# Patient Record
Sex: Male | Born: 1975 | Race: White | Hispanic: No | Smoking: Former smoker
Health system: Southern US, Community
[De-identification: ages and names within clinical notes are randomized; demographics above are authoritative.]

## PROBLEM LIST (undated history)

## (undated) HISTORY — PX: NO PAST SURGERIES: SHX2092

---

## 2018-05-11 ENCOUNTER — Emergency Department
Admission: EM | Admit: 2018-05-11 | Discharge: 2018-05-11 | Disposition: A | Discharge status: Home / Self Care | Payer: Self-pay | Attending: Emergency Medicine | Admitting: Emergency Medicine

## 2018-05-11 ENCOUNTER — Encounter: Payer: Self-pay | Admitting: Emergency Medicine

## 2018-05-11 ENCOUNTER — Other Ambulatory Visit: Payer: Self-pay

## 2018-05-11 DIAGNOSIS — J019 Acute sinusitis, unspecified: Secondary | ICD-10-CM | POA: Insufficient documentation

## 2018-05-11 DIAGNOSIS — J329 Chronic sinusitis, unspecified: Secondary | ICD-10-CM

## 2018-05-11 DIAGNOSIS — B9689 Other specified bacterial agents as the cause of diseases classified elsewhere: Secondary | ICD-10-CM | POA: Insufficient documentation

## 2018-05-11 MED ORDER — AMOXICILLIN-POT CLAVULANATE 875-125 MG PO TABS: 1.0000 | ORAL_TABLET | Freq: Two times a day (BID) | ORAL | 0 refills | Status: DC

## 2018-05-11 MED ORDER — FLUTICASONE PROPIONATE 50 MCG/ACT NA SUSP: 1.0000 | Freq: Two times a day (BID) | NASAL | 0 refills | Status: DC

## 2018-05-11 MED ORDER — CETIRIZINE HCL 10 MG PO TABS: 10.0000 mg | ORAL_TABLET | Freq: Every day | ORAL | 0 refills | Status: DC

## 2018-05-11 MED ORDER — PSEUDOEPH-BROMPHEN-DM 30-2-10 MG/5ML PO SYRP: 10.0000 mL | ORAL_SOLUTION | Freq: Four times a day (QID) | ORAL | 0 refills | Status: DC | PRN

## 2018-05-11 NOTE — ED Provider Notes (Signed)
[facility] Emergency Department Provider Note  ____________________________________________  Time seen: Approximately 3:48 PM  I have reviewed the triage vital signs and the nursing notes.   HISTORY  Chief Complaint Generalized Body Aches    HPI Kenneth Greer is a 42 y.o. male presents the emergency department with sinus pressure, sinus headache, nasal congestion, scratchy throat, cough, fever.  Patient presents with 5 days worth of symptoms.  Patient began with nasal congestion and then progressed to include the other symptoms.  Today, patient developed a fever and he presents the emergency department.  He denies any numbness or tingling in the upper or lower extremities.  No shortness of breath or difficulty breathing.  No abdominal pain, nausea vomiting, diarrhea or constipation.  Patient has been trying Tylenol, Motrin, Alka-Seltzer, cough and cold medicine.    History reviewed. No pertinent past medical history.  There are no active problems to display for this patient.   History reviewed. No pertinent surgical history.  Prior to Admission medications   Medication Sig Start Date End Date Taking? Authorizing Provider  amoxicillin-clavulanate (AUGMENTIN) 875-125 MG tablet Take 1 tablet by mouth 2 (two) times daily. 05/11/18   Mischelle Reeg, Delorise RoyalsJonathan D, PA-C  brompheniramine-pseudoephedrine-DM 30-2-10 MG/5ML syrup Take 10 mLs by mouth 4 (four) times daily as needed. 05/11/18   Jamerion Cabello, Delorise RoyalsJonathan D, PA-C  cetirizine (ZYRTEC) 10 MG tablet Take 1 tablet (10 mg total) by mouth daily. 05/11/18   Britnay Magnussen, Delorise RoyalsJonathan D, PA-C  fluticasone (FLONASE) 50 MCG/ACT nasal spray Place 1 spray into both nostrils 2 (two) times daily. 05/11/18   Thais Silberstein, Delorise RoyalsJonathan D, PA-C    Allergies Patient has no allergy information on record.  No family history on file.  Social History Social History   Tobacco Use  . Smoking status: Not on file  Substance Use Topics  . Alcohol  use: Not on file  . Drug use: Not on file     Review of Systems  Constitutional: Positive fever/chills beginning today Eyes: No visual changes. No discharge ENT: Positive for nasal congestion, sinus pressure,, scratchy throat Cardiovascular: no chest pain. Respiratory: no cough. No SOB. Gastrointestinal: No abdominal pain.  No nausea, no vomiting.  No diarrhea.  No constipation. Musculoskeletal: Negative for musculoskeletal pain. Skin: Negative for rash, abrasions, lacerations, ecchymosis. Neurological: Positive for sinus headache, denies focal weakness or numbness. 10-point ROS otherwise negative.  ____________________________________________   PHYSICAL EXAM:  VITAL SIGNS: ED Triage Vitals  Enc Vitals Group     BP 05/11/18 1520 127/84     Pulse Rate 05/11/18 1520 71     Resp 05/11/18 1520 14     Temp 05/11/18 1520 98.9 F (37.2 C)     Temp Source 05/11/18 1520 Oral     SpO2 05/11/18 1520 97 %     Weight 05/11/18 1520 188 lb (85.3 kg)     Height 05/11/18 1520 5\' 10"  (1.778 m)     Head Circumference --      Peak Flow --      Pain Score 05/11/18 1526 7     Pain Loc --      Pain Edu? --      Excl. in GC? --      Constitutional: Alert and oriented. Well appearing and in no acute distress. Eyes: Conjunctivae are normal. PERRL. EOMI. Head: Atraumatic. ENT:      Ears: EACs and TMs unremarkable bilaterally.      Nose: Moderate congestion/rhinnorhea.  Turbinates are erythematous and edematous.  Patient is  extremely tender to percussion over the maxillary sinuses, worse on the left than right.      Mouth/Throat: Mucous membranes are moist.  Neck: No stridor.  Neck is supple full range of motion Hematological/Lymphatic/Immunilogical: Scattered mobile nontender cervical lymphadenopathy. Cardiovascular: Normal rate, regular rhythm. Normal S1 and S2.  Good peripheral circulation. Respiratory: Normal respiratory effort without tachypnea or retractions. Lungs CTAB. Good air entry  to the bases with no decreased or absent breath sounds. Musculoskeletal: Full range of motion to all extremities. No gross deformities appreciated. Neurologic:  Normal speech and language. No gross focal neurologic deficits are appreciated.  Skin:  Skin is warm, dry and intact. No rash noted. Psychiatric: Mood and affect are normal. Speech and behavior are normal. Patient exhibits appropriate insight and judgement.   ____________________________________________   LABS (all labs ordered are listed, but only abnormal results are displayed)  Labs Reviewed - No data to display ____________________________________________  EKG   ____________________________________________  RADIOLOGY   No results found.  ____________________________________________    PROCEDURES  Procedure(s) performed:    Procedures    Medications - No data to display   ____________________________________________   INITIAL IMPRESSION / ASSESSMENT AND PLAN / ED COURSE  Pertinent labs & imaging results that were available during my care of the patient were reviewed by me and considered in my medical decision making (see chart for details).  Review of the [place] CSRS was performed in accordance of the [facility] prior to dispensing any controlled drugs.      Patient's diagnosis is consistent with bacterial sinusitis.  Patient presents emergency departments with 5 days worth of URI symptoms, increasing sinus pressure, sinus headache.  Patient is very tender to percussion over the sinuses.  Exam is consistent with bacterial sinusitis.  Differential included viral URI, strep, bronchitis, pneumonia, influenza. Patient will be discharged home with prescriptions for Augmentin, Flonase, Zyrtec, Bromfed cough syrup. Patient is to follow up with primary care as needed or otherwise directed. Patient is given ED precautions to return to the ED for any worsening or new  symptoms.     ____________________________________________  FINAL CLINICAL IMPRESSION(S) / ED DIAGNOSES  Final diagnoses:  Bacterial sinusitis      NEW MEDICATIONS STARTED DURING THIS VISIT:  ED Discharge Orders         Ordered    amoxicillin-clavulanate (AUGMENTIN) 875-125 MG tablet  2 times daily     05/11/18 1552    fluticasone (FLONASE) 50 MCG/ACT nasal spray  2 times daily     05/11/18 1552    cetirizine (ZYRTEC) 10 MG tablet  Daily     05/11/18 1552    brompheniramine-pseudoephedrine-DM 30-2-10 MG/5ML syrup  4 times daily PRN     05/11/18 1552              This chart was dictated using voice recognition software/Dragon. Despite best efforts to proofread, errors can occur which can change the meaning. Any change was purely unintentional.    Racheal PatchesCuthriell, Daysen Gundrum D, PA-C 05/11/18 1553    Phineas SemenGoodman, Graydon, MD 05/11/18 [phone]

## 2018-05-11 NOTE — ED Notes (Signed)
See triage note  Presents with body aches ,cough and runny nose    Sx's started about 5 days ago    Low grade fever on arrival

## 2018-05-11 NOTE — ED Triage Notes (Signed)
PT arrives with complaints of generalized body aches, cough, and runny nose for 5 days. Pt reports no relief with OTC medication use.

## 2018-09-28 ENCOUNTER — Other Ambulatory Visit: Payer: Self-pay

## 2018-09-28 ENCOUNTER — Encounter: Payer: Self-pay | Admitting: Emergency Medicine

## 2018-09-28 ENCOUNTER — Ambulatory Visit (INDEPENDENT_AMBULATORY_CARE_PROVIDER_SITE_OTHER): Payer: Self-pay

## 2018-09-28 ENCOUNTER — Ambulatory Visit
Admission: EM | Admit: 2018-09-28 | Discharge: 2018-09-28 | Disposition: A | Discharge status: Home / Self Care | Payer: Self-pay | Attending: Family Medicine | Admitting: Family Medicine

## 2018-09-28 DIAGNOSIS — M654 Radial styloid tenosynovitis [de Quervain]: Secondary | ICD-10-CM

## 2018-09-28 DIAGNOSIS — M6740 Ganglion, unspecified site: Secondary | ICD-10-CM

## 2018-09-28 DIAGNOSIS — M25531 Pain in right wrist: Secondary | ICD-10-CM

## 2018-09-28 MED ORDER — DICLOFENAC SODIUM 75 MG PO TBEC: 75.0000 mg | DELAYED_RELEASE_TABLET | Freq: Two times a day (BID) | ORAL | 0 refills | Status: DC | PRN

## 2018-09-28 NOTE — Discharge Instructions (Signed)
Rest.  Splint as needed.  Medication as needed.  If persists and does not improve, please see [facility] or [facility].  Take care  Dr. Adriana Simas

## 2018-09-28 NOTE — ED Provider Notes (Signed)
[facility]    CSN: [id number] Arrival date & time: 09/28/18  0911  History   Chief Complaint Chief Complaint  Patient presents with   Wrist Pain    right   HPI  43 year old male presents with right wrist pain.  Patient also has a "knot" on his wrist.  Patient reports he has had wrist pain for years.  Has been worsening as of late.  Patient states that he can no longer ignore it.  He reports that the pain is located on the radial aspect.  Patient has a "knot" on the volar aspect of the wrist at the radius.  Patient reports popping/clicking with range of motion of his wrist.  He is particularly bothered by pain at the distal radius and extending proximally.  Seems to be worse with certain activities.  No relieving factors.  Pain is currently 619 severity.  No other associated symptoms.  No other complaints.  History reviewed as below. PMH: Hx of sinusitis  Past Surgical History:  Procedure Laterality Date   NO PAST SURGERIES     Home Medications    Prior to Admission medications   Medication Sig Start Date End Date Taking? Authorizing Provider  diclofenac (VOLTAREN) 75 MG EC tablet Take 1 tablet (75 mg total) by mouth 2 (two) times daily as needed for moderate pain. 09/28/18   Tommie Samsook, Calab Sachse G, DO    Family History Family History  Problem Relation Age of Onset   Osteoarthritis Mother    Osteoarthritis Father     Social History Social History   Tobacco Use   Smoking status: Former Smoker   Smokeless tobacco: Former Engineer, waterUser  Substance Use Topics   Alcohol use: Not Currently    Frequency: Never   Drug use: Not Currently     Allergies   Fish allergy   Review of Systems Review of Systems  Constitutional: Negative.   Musculoskeletal:       Wrist pain (right).   Physical Exam Triage Vital Signs ED Triage Vitals  Enc Vitals Group     BP 09/28/18 0939 118/81     Pulse Rate 09/28/18 0939 (!) 58     Resp 09/28/18 0939 18     Temp 09/28/18 0939 98.1  F (36.7 C)     Temp Source 09/28/18 0939 Oral     SpO2 09/28/18 0939 98 %     Weight 09/28/18 0933 180 lb (81.6 kg)     Height 09/28/18 0933 5\' 10"  (1.778 m)     Head Circumference --      Peak Flow --      Pain Score 09/28/18 0933 6     Pain Loc --      Pain Edu? --      Excl. in GC? --    Updated Vital Signs BP 118/81 (BP Location: Left Arm)    Pulse (!) 58    Temp 98.1 F (36.7 C) (Oral)    Resp 18    Ht 5\' 10"  (1.778 m)    Wt 81.6 kg    SpO2 98%    BMI 25.83 kg/m   Visual Acuity Right Eye Distance:   Left Eye Distance:   Bilateral Distance:    Right Eye Near:   Left Eye Near:    Bilateral Near:     Physical Exam Vitals signs and nursing note reviewed.  Constitutional:      General: He is not in acute distress.    Appearance: Normal appearance.  HENT:     Head: Normocephalic and atraumatic.  Eyes:     General:        Right eye: No discharge.        Left eye: No discharge.     Conjunctiva/sclera: Conjunctivae normal.  Pulmonary:     Effort: Pulmonary effort is normal. No respiratory distress.  Musculoskeletal:     Comments: Right wrist -tenderness to palpation around the distal radius and extending proximally.  Positive Finkelstein test.  Patient has a nodule noted on the radial aspect of the volar wrist.  Appears to be consistent with a ganglion cyst  Neurological:     Mental Status: He is alert.  Psychiatric:        Mood and Affect: Mood normal.        Behavior: Behavior normal.    UC Treatments / Results  Labs (all labs ordered are listed, but only abnormal results are displayed) Labs Reviewed - No data to display  EKG None  Radiology Dg Wrist Complete Right  Result Date: 09/28/2018 CLINICAL DATA:  Chronic right wrist pain. No reported injury. Palpable knot in the right wrist. EXAM: RIGHT WRIST - COMPLETE 3+ VIEW COMPARISON:  None. FINDINGS: No fracture or dislocation. No suspicious focal osseous lesion. No osseous erosions or periosteal reaction. No  significant arthropathy. No radiopaque foreign body. Nodular soft tissue prominence in the volar radial right wrist. IMPRESSION: Nodular soft tissue prominence in the volar radial right wrist. No radiopaque foreign body. No osseous abnormality. Electronically Signed   By: Delbert Phenix M.D.   On: 09/28/2018 10:12    Procedures Procedures (including critical care time)  Medications Ordered in UC Medications - No data to display  Initial Impression / Assessment and Plan / UC Course  I have reviewed the triage vital signs and the nursing notes.  Pertinent labs & imaging results that were available during my care of the patient were reviewed by me and considered in my medical decision making (see chart for details).    43 year old male presents with chronic right wrist pain which is likely secondary to de Quervain's tenosynovitis.  Placed in a thumb spica splint.  Diclofenac as directed.  Advised to see orthopedics if he fails to improve or worsens.  Patient also appears to have a ganglion cyst.  Advised that if this becomes troublesome for him he can have it removed.  Recommended seeing orthopedics.  Final Clinical Impressions(s) / UC Diagnoses   Final diagnoses:  De Quervain's disease (radial styloid tenosynovitis)  Ganglion cyst     Discharge Instructions     Rest.  Splint as needed.  Medication as needed.  If persists and does not improve, please see [facility] or [facility].  Take care  Dr. Adriana Simas    ED Prescriptions    Medication Sig Dispense Auth. Provider   diclofenac (VOLTAREN) 75 MG EC tablet Take 1 tablet (75 mg total) by mouth 2 (two) times daily as needed for moderate pain. 30 tablet Tommie Sams, DO     Controlled Substance Prescriptions [place] Controlled Substance Registry consulted? Not Applicable   Jeiden, [place], DO 09/28/18 1419

## 2018-09-28 NOTE — ED Triage Notes (Signed)
Pt c/o right wrist pain. No known injury. Started this morning. Knot on wrist is not new.

## 2018-12-07 ENCOUNTER — Other Ambulatory Visit: Payer: Self-pay

## 2018-12-07 DIAGNOSIS — Z20822 Contact with and (suspected) exposure to covid-19: Secondary | ICD-10-CM

## 2018-12-12 LAB — NOVEL CORONAVIRUS, NAA: SARS-CoV-2, NAA: NOT DETECTED

## 2018-12-13 ENCOUNTER — Telehealth: Payer: Self-pay | Admitting: General Practice

## 2018-12-13 NOTE — Telephone Encounter (Signed)
Patient informed of negative covid-19 result. Patient verbalized understanding.  °

## 2019-03-20 ENCOUNTER — Other Ambulatory Visit: Payer: Self-pay

## 2019-03-20 DIAGNOSIS — Z20822 Contact with and (suspected) exposure to covid-19: Secondary | ICD-10-CM

## 2019-03-22 LAB — NOVEL CORONAVIRUS, NAA: SARS-CoV-2, NAA: NOT DETECTED

## 2019-11-20 ENCOUNTER — Encounter: Payer: Self-pay | Admitting: Emergency Medicine

## 2019-11-20 ENCOUNTER — Emergency Department
Admission: EM | Admit: 2019-11-20 | Discharge: 2019-11-20 | Disposition: A | Discharge status: Home / Self Care | Payer: Self-pay | Attending: Student in an Organized Health Care Education/Training Program | Admitting: Student in an Organized Health Care Education/Training Program

## 2019-11-20 ENCOUNTER — Other Ambulatory Visit: Payer: Self-pay

## 2019-11-20 ENCOUNTER — Emergency Department: Payer: Self-pay

## 2019-11-20 DIAGNOSIS — M7552 Bursitis of left shoulder: Secondary | ICD-10-CM | POA: Insufficient documentation

## 2019-11-20 DIAGNOSIS — Z87891 Personal history of nicotine dependence: Secondary | ICD-10-CM | POA: Insufficient documentation

## 2019-11-20 DIAGNOSIS — M25512 Pain in left shoulder: Secondary | ICD-10-CM

## 2019-11-20 MED ORDER — PREDNISONE 10 MG (21) PO TBPK: ORAL_TABLET | ORAL | 0 refills | Status: AC

## 2019-11-20 MED ORDER — BACLOFEN 10 MG PO TABS: 10.0000 mg | ORAL_TABLET | Freq: Every day | ORAL | 1 refills | Status: AC

## 2019-11-20 MED ORDER — HYDROCODONE-ACETAMINOPHEN 5-325 MG PO TABS
1.0000 | ORAL_TABLET | Freq: Once | ORAL | Status: AC
  Administered 2019-11-20: 1 via ORAL
  Filled 2019-11-20: qty 1

## 2019-11-20 NOTE — Discharge Instructions (Addendum)
Wear the sling for the next few days.  Apply ice to the left shoulder.  Take medication as prescribed.  No use of the left arm while at work for 1 week.  If you have not improved within 3 to 4 days I would consider seeing orthopedic doctor or chiropractor.  Return if worsening.

## 2019-11-20 NOTE — ED Provider Notes (Signed)
Rear-ended.  [facility] Emergency Department Provider Note  ____________________________________________   First MD Initiated Contact with Patient 11/20/19 1332     (approximate)  I have reviewed the triage vital signs and the nursing notes.   HISTORY  Chief Complaint Shoulder Pain    HPIChristopher Krager is a 44 y.o. male Presents emergency department left shoulder pain after pulling branches yesterday.  he states feels like a tight vice through the left shoulder across the upper back.  Pain is strictly mechanical.  Pain is increased with movement.  No numbness or tingling at this time.  He denies chest pain or shortness of breath.  Pain scale is 7/10.   History reviewed. No pertinent past medical history.  There are no problems to display for this patient.   Past Surgical History:  Procedure Laterality Date  . NO PAST SURGERIES      Prior to Admission medications   Medication Sig Start Date End Date Taking? Authorizing Provider  baclofen (LIORESAL) 10 MG tablet Take 1 tablet (10 mg total) by mouth daily. 11/20/19 11/19/20  Soham Hollett, Roselyn Bering, PA-C  predniSONE (STERAPRED UNI-PAK 21 TAB) 10 MG (21) TBPK tablet Take 6 pills on day one then decrease by 1 pill each day 11/20/19   Faythe Ghee, PA-C    Allergies Fish allergy  Family History  Problem Relation Age of Onset  . Osteoarthritis Mother   . Osteoarthritis Father     Social History Social History   Tobacco Use  . Smoking status: Former Games developer  . Smokeless tobacco: Former Clinical biochemist  . Vaping Use: Former  Substance Use Topics  . Alcohol use: Not Currently  . Drug use: Not Currently    Review of Systems  Constitutional: No fever/chills Eyes: No visual changes. ENT: No sore throat. Respiratory: Denies cough Cardiovascular: Denies chest pain Gastrointestinal: Denies abdominal pain Genitourinary: Negative for dysuria. Musculoskeletal: Negative for back pain.  Positive left  shoulder pain Skin: Negative for rash. Psychiatric: no mood changes,     ____________________________________________   PHYSICAL EXAM:  VITAL SIGNS: ED Triage Vitals  Enc Vitals Group     BP 11/20/19 1241 124/71     Pulse Rate 11/20/19 1241 67     Resp 11/20/19 1241 20     Temp 11/20/19 1241 97.8 F (36.6 C)     Temp Source 11/20/19 1241 Oral     SpO2 11/20/19 1241 99 %     Weight 11/20/19 1236 170 lb (77.1 kg)     Height 11/20/19 1236 5\' 8"  (1.727 m)     Head Circumference --      Peak Flow --      Pain Score 11/20/19 1236 7     Pain Loc --      Pain Edu? --      Excl. in GC? --     Constitutional: Alert and oriented. Well appearing and in no acute distress. Eyes: Conjunctivae are normal.  Head: Atraumatic. Nose: No congestion/rhinnorhea. Mouth/Throat: Mucous membranes are moist.   Neck:  supple no lymphadenopathy noted Cardiovascular: Normal rate, regular rhythm. Heart sounds are normal Respiratory: Normal respiratory effort.  No retractions, lungs c t a  GU: deferred Musculoskeletal: FROM all extremities, warm and well perfused, pain is reproduced with movement of the left shoulder, increased on internal and external rotation, increased with overhead reach, neurovascular is intact, left shoulder is tender at the bursa of the scapula Neurologic:  Normal speech and language.  Skin:  Skin is warm, dry and intact. No rash noted. Psychiatric: Mood and affect are normal. Speech and behavior are normal.  ____________________________________________   LABS (all labs ordered are listed, but only abnormal results are displayed)  Labs Reviewed - No data to display ____________________________________________   ____________________________________________  RADIOLOGY  X-ray of the left shoulder is negative  ____________________________________________   PROCEDURES  Procedure(s) performed:  No  Procedures    ____________________________________________   INITIAL IMPRESSION / ASSESSMENT AND PLAN / ED COURSE  Pertinent labs & imaging results that were available during my care of the patient were reviewed by me and considered in my medical decision making (see chart for details).   Patient is a 44 year old male presents emergency department left shoulder pain.  See HPI.  Physical exam shows left shoulder pain to be mechanical.  Left shoulder is tender with questionable bursitis of the bursa of the scapula, muscles are spasmed.  Neurovascular intact  X-ray of the left shoulder is negative  Did explain the findings to the patient.  He was given a Vicodin while here in the ED for pain.  Be given a prescription for Sterapred and baclofen.  He was placed in a sling and is to not use his left shoulder for 1 week.  He states he understands.  He was discharged stable condition.    Kennieth Plotts was evaluated in Emergency Department on 11/20/2019 for the symptoms described in the history of present illness. He was evaluated in the context of the global COVID-19 pandemic, which necessitated consideration that the patient might be at risk for infection with the SARS-CoV-2 virus that causes COVID-19. Institutional protocols and algorithms that pertain to the evaluation of patients at risk for COVID-19 are in a state of rapid change based on information released by regulatory bodies including the CDC and federal and state organizations. These policies and algorithms were followed during the patient's care in the ED.   As part of my medical decision making, I reviewed the following data within the electronic MEDICAL RECORD NUMBER Nursing notes reviewed and incorporated, Old chart reviewed, Radiograph reviewed , Notes from prior ED visits and [place] Controlled Substance Database  ____________________________________________   FINAL CLINICAL IMPRESSION(S) / ED DIAGNOSES  Final diagnoses:   Bursitis of left shoulder  Acute pain of left shoulder      NEW MEDICATIONS STARTED DURING THIS VISIT:  New Prescriptions   BACLOFEN (LIORESAL) 10 MG TABLET    Take 1 tablet (10 mg total) by mouth daily.   PREDNISONE (STERAPRED UNI-PAK 21 TAB) 10 MG (21) TBPK TABLET    Take 6 pills on day one then decrease by 1 pill each day     Note:  This document was prepared using Dragon voice recognition software and may include unintentional dictation errors.    Faythe Ghee, PA-C 11/20/19 1420    Willy Eddy, MD 11/20/19 1431

## 2019-11-20 NOTE — ED Notes (Signed)
See triage note  Presents with left shoulder pain  States he did not fall   But was pulling branches yesterday  No deformity noted  having pain with movment

## 2019-11-20 NOTE — ED Triage Notes (Signed)
Pt reports was pulling branches yesterday and has hurt his left shoulder. Pt states hurts to raise left arm.

## 2020-02-26 ENCOUNTER — Emergency Department
Admission: EM | Admit: 2020-02-26 | Discharge: 2020-02-26 | Disposition: A | Discharge status: Home / Self Care | Payer: Self-pay | Attending: Emergency Medicine | Admitting: Emergency Medicine

## 2020-02-26 ENCOUNTER — Other Ambulatory Visit: Payer: Self-pay

## 2020-02-26 ENCOUNTER — Encounter: Payer: Self-pay | Admitting: Emergency Medicine

## 2020-02-26 ENCOUNTER — Encounter: Payer: Self-pay | Admitting: *Deleted

## 2020-02-26 DIAGNOSIS — M25512 Pain in left shoulder: Secondary | ICD-10-CM | POA: Insufficient documentation

## 2020-02-26 DIAGNOSIS — X500XXA Overexertion from strenuous movement or load, initial encounter: Secondary | ICD-10-CM | POA: Insufficient documentation

## 2020-02-26 DIAGNOSIS — M25522 Pain in left elbow: Secondary | ICD-10-CM | POA: Insufficient documentation

## 2020-02-26 DIAGNOSIS — Y990 Civilian activity done for income or pay: Secondary | ICD-10-CM | POA: Insufficient documentation

## 2020-02-26 DIAGNOSIS — G8929 Other chronic pain: Secondary | ICD-10-CM | POA: Insufficient documentation

## 2020-02-26 DIAGNOSIS — Z87891 Personal history of nicotine dependence: Secondary | ICD-10-CM | POA: Insufficient documentation

## 2020-02-26 DIAGNOSIS — Z5321 Procedure and treatment not carried out due to patient leaving prior to being seen by health care provider: Secondary | ICD-10-CM | POA: Insufficient documentation

## 2020-02-26 DIAGNOSIS — S46912A Strain of unspecified muscle, fascia and tendon at shoulder and upper arm level, left arm, initial encounter: Secondary | ICD-10-CM | POA: Insufficient documentation

## 2020-02-26 DIAGNOSIS — R202 Paresthesia of skin: Secondary | ICD-10-CM | POA: Insufficient documentation

## 2020-02-26 MED ORDER — IBUPROFEN 800 MG PO TABS: 800.0000 mg | ORAL_TABLET | Freq: Three times a day (TID) | ORAL | 0 refills | Status: AC | PRN

## 2020-02-26 MED ORDER — CYCLOBENZAPRINE HCL 10 MG PO TABS: 10.0000 mg | ORAL_TABLET | Freq: Three times a day (TID) | ORAL | 0 refills | Status: AC | PRN

## 2020-02-26 NOTE — ED Triage Notes (Signed)
Pt called from [facility] to treatment room, no response 

## 2020-02-26 NOTE — ED Triage Notes (Signed)
Pt reports pain to left elbow for several months. Pt states was seen and told it was bursitis but states he followed up with [facility] and was told it was fine. Pt also reports pain to left elbow and forearm for several months.

## 2020-02-26 NOTE — ED Notes (Signed)
Pt called x's 3, no response ?

## 2020-02-26 NOTE — ED Triage Notes (Addendum)
Pt has left shoulder pain and left elbow area.  No known injury  Sx for approx 2 weeks.  Taking otc meds without relief.   Good rom.  Pt alert.  Pt was here for similar sx 6/21 for similar sx

## 2020-02-26 NOTE — Discharge Instructions (Addendum)
Please do not lift anything heavier than a gallon of milk with his left hand until pain improves.

## 2020-02-26 NOTE — ED Notes (Signed)
Pt ambulated out of ED with steady gait accompanied by girlfriend

## 2020-02-26 NOTE — ED Triage Notes (Signed)
Pt called from [facility] to treatment room x's 3, no response 

## 2020-02-26 NOTE — ED Provider Notes (Signed)
[facility] Emergency Department Provider Note   ____________________________________________   First MD Initiated Contact with Patient 02/26/20 1303     (approximate)  I have reviewed the triage vital signs and the nursing notes.   HISTORY  Chief Complaint Arm Injury and Shoulder Pain    HPI Kenneth Greer is a 44 y.o. male with no stated past medical history presents for left shoulder pain.  Patient also endorses associated left upper extremity paresthesias.  Patient states that approximately 2 months ago, he began having a similar pain after lifting a door at his job.  Patient states at that time he felt a pop and began having significant subscapular pain in the left shoulder.  Patient was immobilized at that time and placed on steroids and muscle relaxants with relief of this pain.  Patient states that he was pain-free until approximately 2 days ago when he was back at work lifting heavy buckets and had a recurrence of this pain.  Patient states that it is similar to the first time this happened with 10/10, subscapular pain on the left that radiates down his left upper extremity and occasionally causes paresthesias.  Patient states that any movement in his left arm worsens this pain and it is partially relieved at rest and when leaning forward         History reviewed. No pertinent past medical history.  There are no problems to display for this patient.   Past Surgical History:  Procedure Laterality Date  . NO PAST SURGERIES      Prior to Admission medications   Medication Sig Start Date End Date Taking? Authorizing Provider  baclofen (LIORESAL) 10 MG tablet Take 1 tablet (10 mg total) by mouth daily. 11/20/19 11/19/20  Fisher, Roselyn Bering, PA-C  cyclobenzaprine (FLEXERIL) 10 MG tablet Take 1 tablet (10 mg total) by mouth 3 (three) times daily as needed for muscle spasms. 02/26/20   Merwyn Katos, MD  ibuprofen (ADVIL) 800 MG tablet Take 1 tablet  (800 mg total) by mouth every 8 (eight) hours as needed. 02/26/20   Merwyn Katos, MD  predniSONE (STERAPRED UNI-PAK 21 TAB) 10 MG (21) TBPK tablet Take 6 pills on day one then decrease by 1 pill each day 11/20/19   Faythe Ghee, PA-C    Allergies Fish allergy  Family History  Problem Relation Age of Onset  . Osteoarthritis Mother   . Osteoarthritis Father     Social History Social History   Tobacco Use  . Smoking status: Former Games developer  . Smokeless tobacco: Former Clinical biochemist  . Vaping Use: Former  Substance Use Topics  . Alcohol use: Not Currently  . Drug use: Not Currently    Review of Systems Constitutional: No fever/chills Eyes: No visual changes. ENT: No sore throat. Cardiovascular: Denies chest pain. Respiratory: Denies shortness of breath. Gastrointestinal: No abdominal pain.  No nausea, no vomiting.  No diarrhea. Genitourinary: Negative for dysuria. Musculoskeletal: Endorses left shoulder pain Skin: Negative for rash. Neurological: Negative for headaches, weakness/numbness/paresthesias in any extremity Psychiatric: Negative for suicidal ideation/homicidal ideation   ____________________________________________   PHYSICAL EXAM:  VITAL SIGNS: ED Triage Vitals  Enc Vitals Group     BP 02/26/20 1120 126/61     Pulse Rate 02/26/20 1120 63     Resp 02/26/20 1120 16     Temp 02/26/20 1120 98 F (36.7 C)     Temp Source 02/26/20 1120 Oral     SpO2 02/26/20 1120  97 %     Weight 02/26/20 1103 170 lb (77.1 kg)     Height 02/26/20 1103 5\' 10"  (1.778 m)     Head Circumference --      Peak Flow --      Pain Score 02/26/20 1103 10     Pain Loc --      Pain Edu? --      Excl. in GC? --    Constitutional: Alert and oriented. Well appearing and in no acute distress. Eyes: Conjunctivae are normal. PERRL. Head: Atraumatic. Nose: No congestion/rhinnorhea. Mouth/Throat: Mucous membranes are moist. Neck: No stridor Cardiovascular: Grossly normal heart  sounds.  Good peripheral circulation. Respiratory: Normal respiratory effort.  No retractions. Gastrointestinal: Soft and nontender. No distention. Musculoskeletal: No obvious deformities, tenderness to palpation over left medial parascapular musculature and with active or passive abduction of the left shoulder Neurologic:  Normal speech and language. No gross focal neurologic deficits are appreciated. Skin:  Skin is warm and dry. No rash noted. Psychiatric: Mood and affect are normal. Speech and behavior are normal.  ____________________________________________   LABS (all labs ordered are listed, but only abnormal results are displayed)  Labs Reviewed - No data to display ____________________________________________   PROCEDURES  Procedure(s) performed (including Critical Care):  Procedures   ____________________________________________   INITIAL IMPRESSION / ASSESSMENT AND PLAN / ED COURSE  As part of my medical decision making, I reviewed the following data within the electronic MEDICAL RECORD NUMBER Nursing notes reviewed and incorporated, Labs reviewed, EKG interpreted, Old chart reviewed, Radiograph reviewed and Notes from prior ED visits reviewed and incorporated        Patient is a 44 year old male who presents for left shoulder pain that is recurrent Given history, exam and workup I have low suspicion for fracture, dislocation, significant ligamentous injury, septic arthritis, gout flare, new autoimmune arthropathy, or gonococcal arthropathy.  Interventions: None necessary as patient had full radiology evaluation after the first injury Disposition: Discharge home with strict return precautions and instructions for prompt primary care follow up in the next week.      ____________________________________________   FINAL CLINICAL IMPRESSION(S) / ED DIAGNOSES  Final diagnoses:  Chronic scapular pain  Strain of left shoulder, initial encounter     ED Discharge  Orders         Ordered    cyclobenzaprine (FLEXERIL) 10 MG tablet  3 times daily PRN        02/26/20 1334    ibuprofen (ADVIL) 800 MG tablet  Every 8 hours PRN        02/26/20 1334           Note:  This document was prepared using Dragon voice recognition software and may include unintentional dictation errors.   04/27/20, MD 02/26/20 [phone]

## 2020-10-30 IMAGING — CR RIGHT WRIST - COMPLETE 3+ VIEW
4 series · 4 of 4 positions shown · non-contrast
Comparison: None.

CLINICAL DATA: Chronic right wrist pain. No reported injury.
Palpable knot in the right wrist.

EXAM:
RIGHT WRIST - COMPLETE 3+ VIEW

[wrist pa]
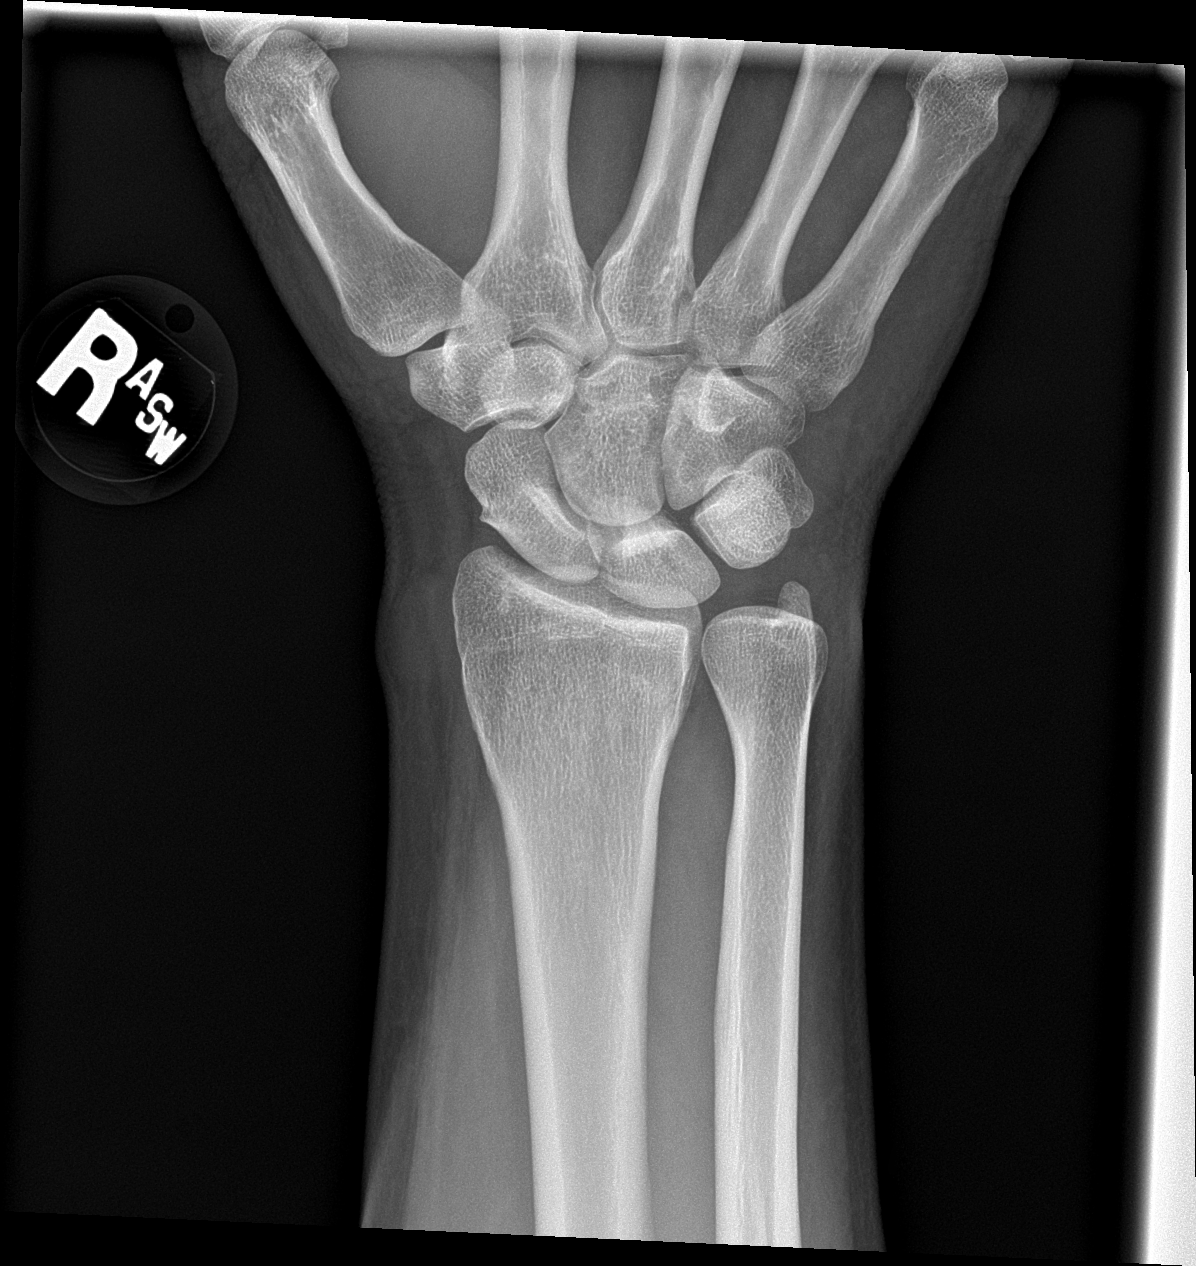

[wrist obl]
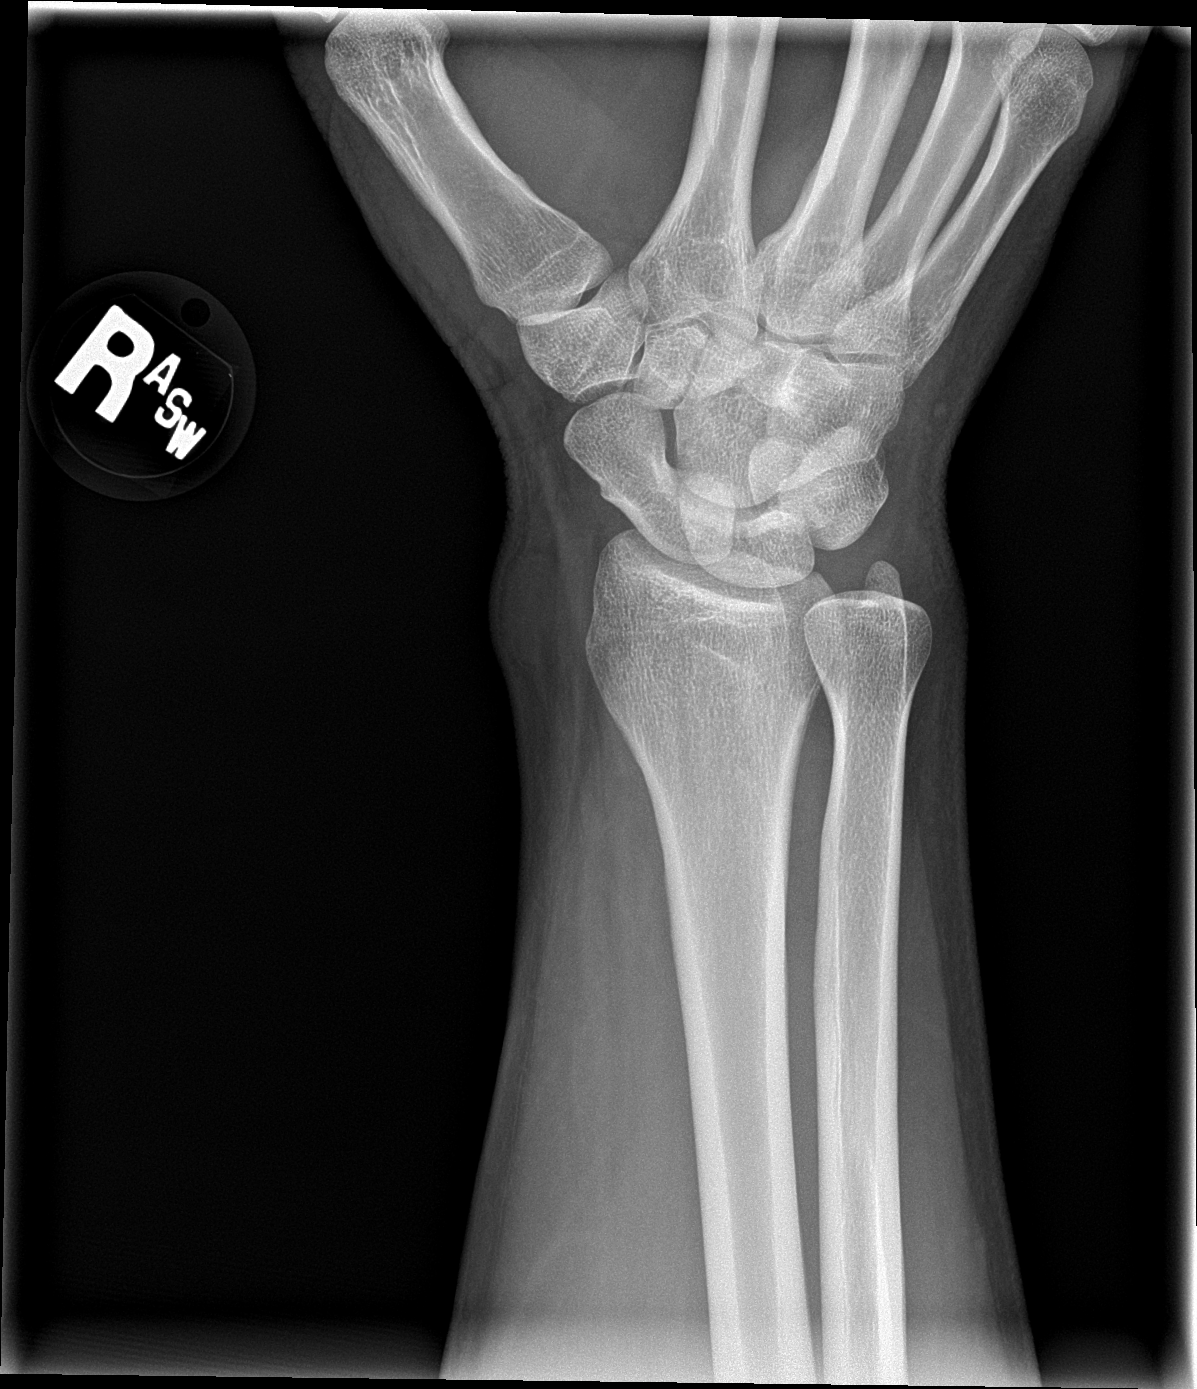

[wrist lat]
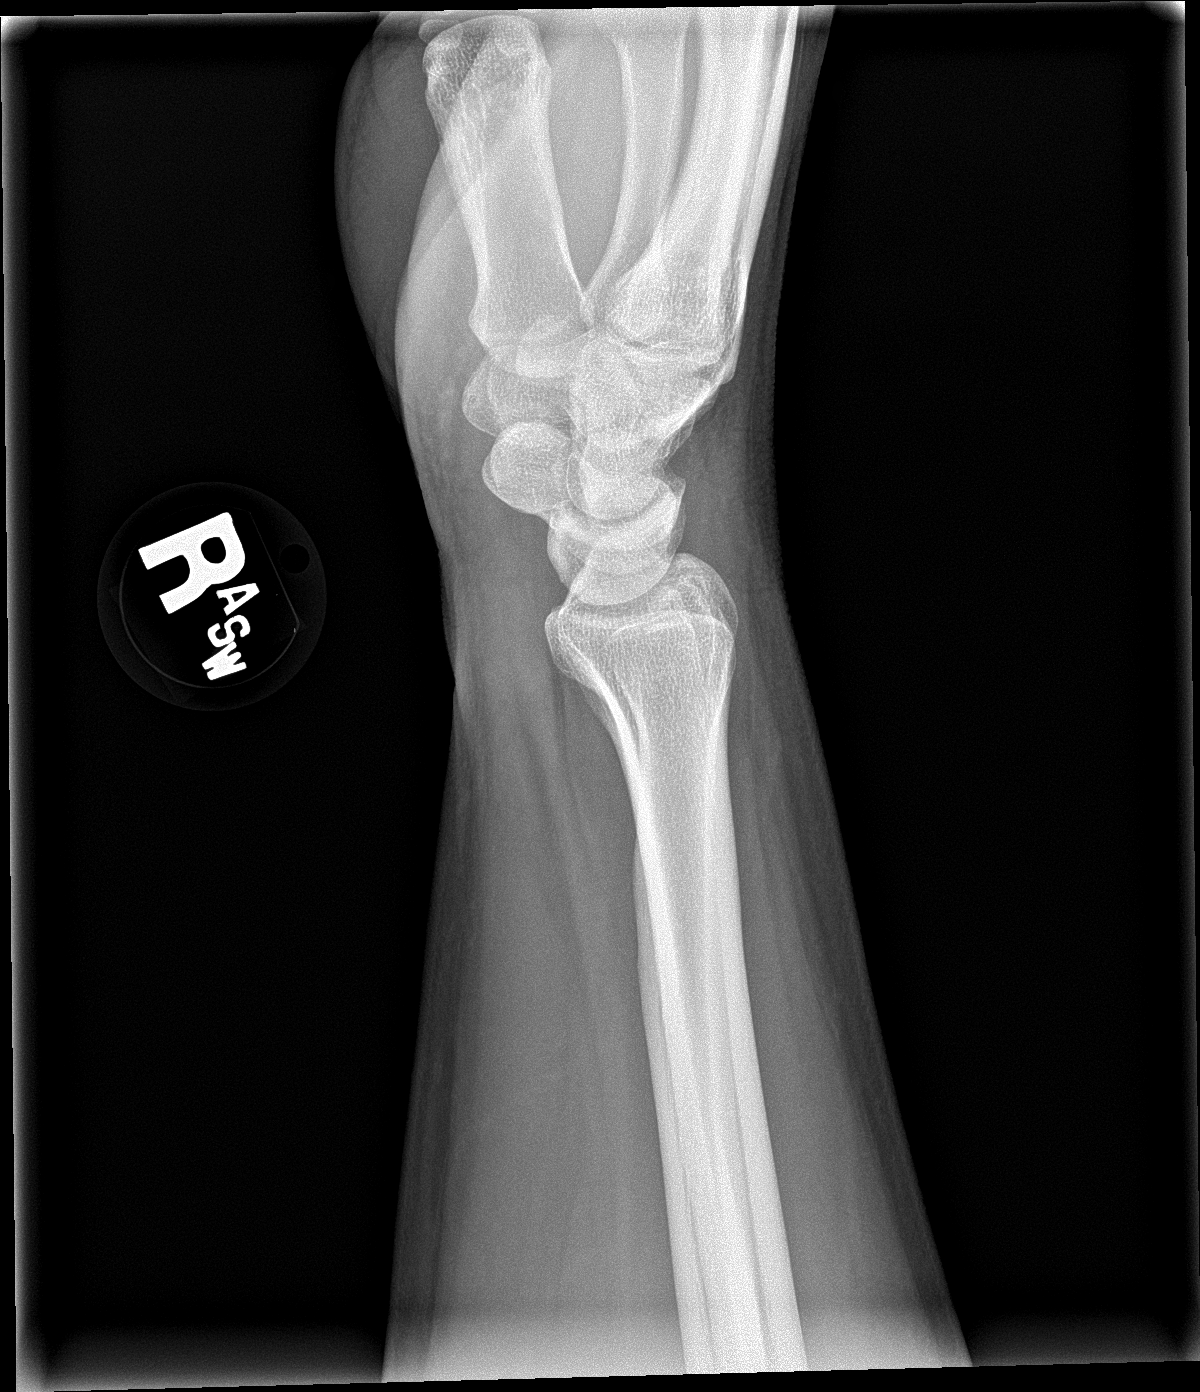

[wrist navicular]
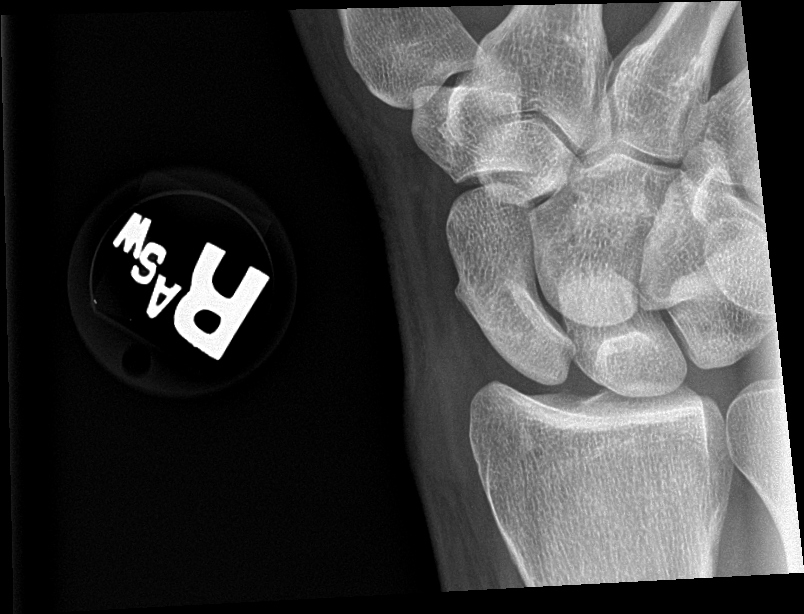

[4 of 4 positions shown; findings below may reference images not displayed]

FINDINGS: No fracture or dislocation. No suspicious focal osseous lesion. No
osseous erosions or periosteal reaction. No significant arthropathy.
No radiopaque foreign body. Nodular soft tissue prominence in the
volar radial right wrist.
IMPRESSION: Nodular soft tissue prominence in the volar radial right wrist. No
radiopaque foreign body. No osseous abnormality.
